# Patient Record
Sex: Male | Born: 1975 | Race: White | Hispanic: No | Marital: Single | Smoking: Never smoker
Health system: Southern US, Community
[De-identification: ages and names within clinical notes are randomized; demographics above are authoritative.]

## PROBLEM LIST (undated history)

## (undated) DIAGNOSIS — C801 Malignant (primary) neoplasm, unspecified: Secondary | ICD-10-CM

## (undated) DIAGNOSIS — F419 Anxiety disorder, unspecified: Secondary | ICD-10-CM

## (undated) HISTORY — PX: FOOT AMPUTATION: SHX951

## (undated) HISTORY — DX: Malignant (primary) neoplasm, unspecified: C80.1

## (undated) HISTORY — PX: HERNIA REPAIR: SHX51

## (undated) HISTORY — DX: Anxiety disorder, unspecified: F41.9

---

## 1998-05-30 ENCOUNTER — Encounter: Payer: Self-pay | Admitting: Orthopedic Surgery

## 1998-05-30 ENCOUNTER — Ambulatory Visit (HOSPITAL_COMMUNITY): Admission: RE | Admit: 1998-05-30 | Discharge: 1998-05-30 | Payer: Self-pay | Admitting: Orthopedic Surgery

## 1998-11-29 ENCOUNTER — Ambulatory Visit (HOSPITAL_BASED_OUTPATIENT_CLINIC_OR_DEPARTMENT_OTHER): Admission: RE | Admit: 1998-11-29 | Discharge: 1998-11-29 | Payer: Self-pay | Admitting: General Surgery

## 2017-09-03 ENCOUNTER — Encounter: Payer: Self-pay | Admitting: Family Medicine

## 2017-09-30 ENCOUNTER — Encounter: Payer: Self-pay | Admitting: Family Medicine

## 2017-09-30 ENCOUNTER — Ambulatory Visit: Payer: Self-pay

## 2017-09-30 ENCOUNTER — Ambulatory Visit (INDEPENDENT_AMBULATORY_CARE_PROVIDER_SITE_OTHER): Payer: BLUE CROSS/BLUE SHIELD | Admitting: Family Medicine

## 2017-09-30 VITALS — BP 110/76 | HR 89 | Ht 67.0 in | Wt 193.0 lb

## 2017-09-30 DIAGNOSIS — M7989 Other specified soft tissue disorders: Secondary | ICD-10-CM

## 2017-09-30 DIAGNOSIS — R2242 Localized swelling, mass and lump, left lower limb: Secondary | ICD-10-CM | POA: Diagnosis not present

## 2017-09-30 DIAGNOSIS — M79672 Pain in left foot: Secondary | ICD-10-CM | POA: Diagnosis not present

## 2017-09-30 NOTE — Progress Notes (Signed)
[facility] [address] [place], [place] [address] Phone: [phone] Subjective:    I'm seeing this patient by the request  of:  Derinda Late, MD   CC: Foot pain  [id number]  Ricky Hansen is a 42 y.o. male coming in with complaint of left foot pain in the midfoot. He had swelling in that area 2 years ago when he was in [place] for his [place]. His foot would become aggrevated when walking for prolonged periods. He notes some swelling and black toes after walking for prolonged periods in boots, sandals and tennis shoes. A couple weeks ago he had an increase in swelling and is not sure why. He does go to the gym but did not do more than usual to increase his swelling and discoloration in his toes.  Patient was seen in urgent care facility.  Did have an ultrasound at that time and was concern for potential cellulitis.  Patient was given antibiotics which may have helped some of the mild discoloration but did not help well with the underlying swelling.  Patient would like to know what it is and what to do.       History reviewed. No pertinent past medical history. History reviewed. No pertinent surgical history. Social History   Socioeconomic History  . Marital status: Single    Spouse name: Not on file  . Number of children: Not on file  . Years of education: Not on file  . Highest education level: Not on file  Occupational History  . Not on file  Social Needs  . Financial resource strain: Not on file  . Food insecurity:    Worry: Not on file    Inability: Not on file  . Transportation needs:    Medical: Not on file    Non-medical: Not on file  Tobacco Use  . Smoking status: Not on file  Substance and Sexual Activity  . Alcohol use: Not on file  . Drug use: Not on file  . Sexual activity: Not on file  Lifestyle  . Physical activity:    Days per week: Not on file    Minutes per session: Not on file  . Stress: Not on file  Relationships   . Social connections:    Talks on phone: Not on file    Gets together: Not on file    Attends religious service: Not on file    Active member of club or organization: Not on file    Attends meetings of clubs or organizations: Not on file    Relationship status: Not on file  Other Topics Concern  . Not on file  Social History Narrative  . Not on file   Not on File History reviewed. No pertinent family history.   Past medical history, social, surgical and family history all reviewed in electronic medical record.  No pertanent information unless stated regarding to the chief complaint.   Review of Systems:Review of systems updated and as accurate as of 09/30/17  No headache, visual changes, nausea, vomiting, diarrhea, constipation, dizziness, abdominal pain, skin rash, fevers, chills, night sweats, weight loss, swollen lymph nodes, body aches, joint swelling, muscle aches, chest pain, shortness of breath, mood changes.   Objective  Blood pressure 110/76, pulse 89, height 5' 7"  (1.702 m), weight 193 lb (87.5 kg), SpO2 92 %. Systems examined below as of 09/30/17   General: No apparent distress alert and oriented x3 mood and affect normal, dressed appropriately.  HEENT:  Pupils equal, extraocular movements intact  Respiratory: Patient's speak in full sentences and does not appear short of breath  Cardiovascular: No lower extremity edema, non tender, no erythema  Skin: Warm dry intact with no signs of infection or rash on extremities or on axial skeleton.  Abdomen: Soft nontender  Neuro: Cranial nerves II through XII are intact, neurovascularly intact in all extremities with 2+ DTRs and 2+ pulses.  Lymph: No lymphadenopathy of posterior or anterior cervical chain or axillae bilaterally.  Gait normal with good balance and coordination.  MSK:  Non tender with full range of motion and good stability and symmetric strength and tone of shoulders, elbows, wrist, hip, knee bilaterally.    Patient's left ankle exam shows that he does have significant swelling over the medial dorsal aspect of the foot.  Seems to be fluctuant.  Not tender to palpation.  Does have bruising distally to this area.  Patient states that this is constant.  This swelling seems to go past the ankle mortise joints more proximally.  At the most proximal aspect of the swelling there is a very small inclusion cyst that is freely movable.  It seems to be within the anterior tibialis tendon sheath.  Patient's ankle has full range of motion.  Very minimally tender to palpation at all in this area.  Full range of motion the ankle and neurovascularly intact distally.  Deep tendon reflexes intact.  Good capillary refill distally.  Limited musculoskeletal ultrasound was performed and interpreted by Lyndal Pulley  Limited ultrasound of the dorsum of the left foot shows that patient does have soft tissue changes noted.  Patient does have edema noted and superficial to the soft tissue changes.  Significant abnormality of the vascularity in the area.  Difficult to assess if more of a varicose vein versus the potential for an AV malformation secondary to the pulsatile aspect and the compression of him.  No true sign of any type of deep venous thrombosis at this time. 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    Impression and Recommendations:     This case required medical decision making of moderate complexity.      Note: This dictation was prepared with Dragon dictation along with smaller phrase technology. Any transcriptional errors that result from this process are unintentional.

## 2017-09-30 NOTE — Assessment & Plan Note (Signed)
On ultrasound today patient does have findings that are consistent with a soft tissue mass with a very abnormal vascularity.  I do not believe that this is a sarcoma though.  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  I do not see any signs of an infectious etiology at this moment and patient has just gone off of different medications.  Differential includes gout but I do not think so.  Due to the longevity of this over the course of the last 3 years with potentially some mild increasing in size and how it is affecting patient's daily activities advanced imaging including MRI with and without contrast will be necessary.  We will evaluate this and see what scan going on and patient will follow up with me again after imaging

## 2017-09-30 NOTE — Patient Instructions (Signed)
Great to meet you  Sorry not for a great answer I do feel there is likely a abnormality of the blood vessel.  We will get MRI of the foot and ankle with and with out contrast to check it out.  I will write you and tell you what it says and give you choices on next step

## 2017-10-09 ENCOUNTER — Telehealth: Payer: Self-pay | Admitting: Family Medicine

## 2017-10-09 ENCOUNTER — Other Ambulatory Visit: Payer: Self-pay | Admitting: Family Medicine

## 2017-10-09 NOTE — Telephone Encounter (Signed)
Copied from [place] [id number]. Topic: Quick Communication - See Telephone Encounter >> Oct 09, 2017  4:08 PM Aurelio Brash B wrote: CRM for notification. See Telephone encounter for: 10/09/17.  Ena Dawley from [facility]  called to say the Pt is schedule  For a foot and ankle mri ,  only 1 of those are authorized, do we need to cancel  the other?   Her  Contact number is [phone]

## 2017-10-12 NOTE — Telephone Encounter (Signed)
lmovm making noel aware that the pt's insurance would only approve the  MRI ankle.

## 2017-10-14 ENCOUNTER — Other Ambulatory Visit: Payer: BLUE CROSS/BLUE SHIELD

## 2017-10-14 ENCOUNTER — Ambulatory Visit
Admission: RE | Admit: 2017-10-14 | Discharge: 2017-10-14 | Disposition: A | Discharge status: Home / Self Care | Payer: BLUE CROSS/BLUE SHIELD | Source: Ambulatory Visit | Attending: Family Medicine | Admitting: Family Medicine

## 2017-10-14 DIAGNOSIS — R2242 Localized swelling, mass and lump, left lower limb: Secondary | ICD-10-CM

## 2017-10-14 MED ORDER — GADOBENATE DIMEGLUMINE 529 MG/ML IV SOLN
17.0000 mL | Freq: Once | INTRAVENOUS | Status: AC | PRN
  Administered 2017-10-14: 17 mL via INTRAVENOUS

## 2017-10-16 ENCOUNTER — Other Ambulatory Visit: Payer: Self-pay

## 2017-10-16 DIAGNOSIS — M7989 Other specified soft tissue disorders: Secondary | ICD-10-CM

## 2017-10-16 DIAGNOSIS — R2242 Localized swelling, mass and lump, left lower limb: Principal | ICD-10-CM

## 2017-11-02 ENCOUNTER — Ambulatory Visit (INDEPENDENT_AMBULATORY_CARE_PROVIDER_SITE_OTHER): Payer: BLUE CROSS/BLUE SHIELD | Admitting: Family Medicine

## 2017-11-02 VITALS — BP 122/80 | HR 62 | Ht 67.0 in | Wt 188.0 lb

## 2017-11-02 DIAGNOSIS — C499 Malignant neoplasm of connective and soft tissue, unspecified: Secondary | ICD-10-CM | POA: Diagnosis not present

## 2017-11-02 NOTE — Assessment & Plan Note (Signed)
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  Differential included a AV malformation previously.  Patient is scheduled for an open biopsy tomorrow for further evaluation.  Patient would like second opinion before he undergoes the possibility of amputation which I think it is significantly reasonable.  Will be referred today.  Discussed with patient's that we are here for any type of questions necessary and we can continue to help him through this difficult decision.  Spent  25 minutes with patient face-to-face and had greater than 50% of counseling including as described above in assessment and plan.

## 2017-11-02 NOTE — Progress Notes (Signed)
[facility] [address] [place], [place] [address] Phone: [phone] Subjective:    I'm seeing this patient by the request  of:   Derinda Late, MD   CC: Left foot mass  EZM:[id number]  Ricky Hansen is a 42 y.o. male coming in with complaint of left foot numbness.  Patient was seen previously and on ultrasound there was significant atypical vascularization noted.  Very large soft tissue mass appreciated.  Sent for an MRI.  MRI showed a soft tissue mass along the dorsal medial aspect of the left foot for vascular malformation versus a soft tissue sarcoma.  Patient was then sent to Dr. Leonides Schanz for further evaluation.  Core biopsy done.  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  Patient has had recommendations now above below the knee amputation.  Patient was going to have an open biopsy done.  Patient is scheduled for this tomorrow.  Patient states he wants to make sure he is making the right decision.  Wanting to know what else can be possibly done.  Wanting to see if there could be a second opinion.     No past medical history on file. No past surgical history on file. Social History   Socioeconomic History  . Marital status: Single    Spouse name: Not on file  . Number of children: Not on file  . Years of education: Not on file  . Highest education level: Not on file  Occupational History  . Not on file  Social Needs  . Financial resource strain: Not on file  . Food insecurity:    Worry: Not on file    Inability: Not on file  . Transportation needs:    Medical: Not on file    Non-medical: Not on file  Tobacco Use  . Smoking status: Not on file  Substance and Sexual Activity  . Alcohol use: Not on file  . Drug use: Not on file  . Sexual activity: Not on file  Lifestyle  . Physical activity:    Days per week: Not on file    Minutes per session: Not on file  . Stress: Not on file  Relationships  . Social connections:   Talks on phone: Not on file    Gets together: Not on file    Attends religious service: Not on file    Active member of club or organization: Not on file    Attends meetings of clubs or organizations: Not on file    Relationship status: Not on file  Other Topics Concern  . Not on file  Social History Narrative  . Not on file   Not on File No family history on file.   Past medical history, social, surgical and family history all reviewed in electronic medical record.  No pertanent information unless stated regarding to the chief complaint.   Review of Systems:Review of systems updated and as accurate as of 11/02/17  No headache, visual changes, nausea, vomiting, diarrhea, constipation, dizziness, abdominal pain, skin rash, fevers, chills, night sweats, weight loss, swollen lymph nodes, body aches, joint swelling, muscle aches, chest pain, shortness of breath, mood changes.   Objective  Blood pressure 122/80, pulse 62, height 5' 7"  (1.702 m), weight 188 lb (85.3 kg), SpO2 98 %. Systems examined below as of 11/02/17   General: No apparent distress alert and oriented x3 mood and affect normal, dressed appropriately.  HEENT: Pupils equal, extraocular movements intact  Respiratory: Patient's speak in  full sentences and does not appear short of breath  Cardiovascular: No lower extremity edema, non tender, no erythema  Skin: Warm dry intact with no signs of infection or rash on extremities or on axial skeleton.  Abdomen: Soft nontender  Neuro: Cranial nerves II through XII are intact, neurovascularly intact in all extremities with 2+ DTRs and 2+ pulses.  Lymph: No lymphadenopathy of posterior or anterior cervical chain or axillae bilaterally.  Gait normal with good balance and coordination.  MSK:  Non tender with full range of motion and good stability and symmetric strength and tone of shoulders, elbows, wrist, hip, knee and bilaterally.   Left ankle exam still shows the patient has  significant swelling over the medial dorsal aspect of the foot.  Nontender.  Not movable.  Still some mild underlying bruising still noted.  It does go proximal past the ankle mortise.  Ankle does still have full range of motion.  Minimally tender to palpation around the ankle itself.  Neurovascular intact distally.  Dorsalis pedis pulse brisk   Impression and Recommendations:     This case required medical decision making of moderate complexity.      Note: This dictation was prepared with Dragon dictation along with smaller phrase technology. Any transcriptional errors that result from this process are unintentional.

## 2017-11-02 NOTE — Patient Instructions (Signed)
Good to see you  We will figure it out.  We will get you to [facility] as well to discuss Dr. Standley Brooking office and we will try to get it set up  Good luck with the procedure tomorrow and lets see what comes back  Write me any questions Expect a lot more tests and information coming your way and I will help you navigate I will try to get you info on the other docs in other states if you want.

## 2017-11-05 ENCOUNTER — Telehealth: Payer: Self-pay | Admitting: Family Medicine

## 2017-11-05 DIAGNOSIS — C499 Malignant neoplasm of connective and soft tissue, unspecified: Secondary | ICD-10-CM

## 2017-11-05 NOTE — Telephone Encounter (Signed)
PET scan ordered & scheduled 6.10.19 @ 2pm. Pt made aware.

## 2017-11-05 NOTE — Telephone Encounter (Signed)
Copied from [place] [phone] [phone]. Topic: Quick Communication - See Telephone Encounter >> Nov 05, 2017 11:18 AM Bea Graff, NT wrote: CRM for notification. See Telephone encounter for: 11/05/17. Pt requesting a call from Dr. 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

## 2017-11-05 NOTE — Telephone Encounter (Signed)
Called patient back.  Patient undergone biopsy.  Final pathology results will not be known for 2 to 3 weeks.  So far inconclusive findings.  Patient is concerned though and wants to make sure that there is nothing else that is potentially spreading.  Would like to know what would be the next best test.  Patient brought up the idea of a PET scan.  I do feel that this could help out with grading of anything that would occur and I will order that at this time.  Patient is being scheduled also for second opinion from case amputation as necessary.  We will continue to monitor.

## 2017-11-09 ENCOUNTER — Ambulatory Visit (HOSPITAL_COMMUNITY)
Admission: RE | Admit: 2017-11-09 | Discharge: 2017-11-09 | Disposition: A | Discharge status: Home / Self Care | Payer: BLUE CROSS/BLUE SHIELD | Source: Ambulatory Visit | Attending: Family Medicine | Admitting: Family Medicine

## 2017-11-09 DIAGNOSIS — C499 Malignant neoplasm of connective and soft tissue, unspecified: Secondary | ICD-10-CM | POA: Diagnosis not present

## 2017-11-09 LAB — GLUCOSE, CAPILLARY: GLUCOSE-CAPILLARY: 111 mg/dL — AB (ref 65–99)

## 2017-11-09 MED ORDER — FLUDEOXYGLUCOSE F - 18 (FDG) INJECTION: 9.1000 | Freq: Once | INTRAVENOUS | Status: DC | PRN

## 2017-11-13 ENCOUNTER — Encounter: Payer: Self-pay | Admitting: Family Medicine

## 2017-11-26 ENCOUNTER — Encounter: Payer: Self-pay | Admitting: Family Medicine

## 2018-06-15 ENCOUNTER — Ambulatory Visit: Payer: BLUE CROSS/BLUE SHIELD | Attending: Physical Medicine & Rehabilitation | Admitting: Physical Therapy

## 2018-06-15 DIAGNOSIS — M79605 Pain in left leg: Secondary | ICD-10-CM | POA: Insufficient documentation

## 2018-06-15 DIAGNOSIS — R2689 Other abnormalities of gait and mobility: Secondary | ICD-10-CM | POA: Diagnosis not present

## 2018-06-15 DIAGNOSIS — R2681 Unsteadiness on feet: Secondary | ICD-10-CM | POA: Insufficient documentation
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
## 2018-06-21 ENCOUNTER — Encounter: Payer: Self-pay | Admitting: Physical Therapy

## 2018-06-21 ENCOUNTER — Ambulatory Visit: Payer: BLUE CROSS/BLUE SHIELD | Admitting: Physical Therapy

## 2018-06-21 DIAGNOSIS — R2689 Other abnormalities of gait and mobility: Secondary | ICD-10-CM | POA: Diagnosis not present

## 2018-06-21 DIAGNOSIS — M79605 Pain in left leg: Secondary | ICD-10-CM

## 2018-06-21 DIAGNOSIS — R2681 Unsteadiness on feet: Secondary | ICD-10-CM

## 2018-06-22 NOTE — Therapy (Signed)
[facility] [address] [place], [place], [address] Phone: [phone]   Fax:  [phone]  Physical Therapy Treatment  Patient Details  Name: Ricky Hansen MRN: [id number] Date of Birth: 13-Dec-1975 Referring Provider (PT): Rolena Infante, MD   Encounter Date: 06/21/2018  PT End of Session - 06/21/18 1146    Visit Number  2    Number of Visits  9    Date for PT Re-Evaluation  08/13/18    Authorization Type  BCBS    PT Start Time  1100    PT Stop Time  1138    PT Time Calculation (min)  38 min    Activity Tolerance  Patient tolerated treatment well    Behavior During Therapy  [facility] for tasks assessed/performed       History reviewed. No pertinent past medical history.  History reviewed. No pertinent surgical history.  There were no vitals filed for this visit.  Subjective Assessment - 06/21/18 1100    Subjective  He has been wearing prosthesis 5 hrs 2x/day. Around 2 hrs of 2nd wear the shin bone becomes uncomfortable.     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 His work does require some lifting, climbing, push/pull    Currently in Pain?  No/denies                       [facility] Adult PT Treatment/Exercise - 06/21/18 1100      Transfers   Transfers  Floor to Transfer    Floor to Transfer  5: Supervision    Floor to Transfer Details (indicate cue type and reason)  demo & instruction in floor transfer pushing on horizontal surface.  Recommend not using prosthesis for half kneel due to hole in suction liner will break the suction.   PT also demo kneeling on kneel bench or pillows to decrease knee flexion & soften prosthesis pressure against floor. Pt verbalized & return demo understanding.       Ambulation/Gait   Ambulation/Gait  Yes    Ambulation/Gait Assistance  5: Supervision    Ambulation/Gait Assistance Details  working on gait witout abduction: visual, demo, tactile & verbal cues    Ambulation Distance (Feet)  500 Feet    Assistive device  Prosthesis;None    Ambulation Surface  Level;Indoor      Therapeutic Activites    Therapeutic Activities  Lifting;Work Sports coach with TTA prosthesis Pt return demo initially picking up 25# box and placing on floor.      Work Radiation protection practitioner push / pull motions with weight shift between feet & pt return demo understanding.  PT demo, instructed in climbing an A-frame ladder, pt return demo with tactile / manual guard & verbal cues.       Neuro Re-ed    Neuro Re-ed Details   standing without abduction and weight shift to midline prior to beginning stationary activity      Prosthetics   Prosthetic Care Comments   PT instructed in adjusting ply socks including use of cut-off sock.  Increase wear to 6hrs drying half way 2x/day.     Current prosthetic wear tolerance (days/week)   daily    Current prosthetic wear tolerance (#hours/day)  5 hrs 2x/day drying half way    Edema  none    Residual limb condition   3 wounds with scabs are smaller with dry edges. No other issues noted.     Education Provided  Skin check;Residual limb care;Correct ply sock adjustment;Proper wear schedule/adjustment    Person(s) Educated  Patient    Education Method  Explanation;Verbal cues;Demonstration    Education Method  Verbalized understanding;Needs further instruction               PT Short Term Goals - 06/15/18 2215      PT SHORT TERM GOAL #1   Title  Patient tolerates prosthesis wear >12hrs/day total without skin issues. (All STG Target Dates: 07/16/2018)    Time  1    Period  Months    Status  New    Target Date  07/16/18      PT SHORT TERM GOAL #2   Title  Patient verbalizes proper adjustment of ply socks for limb volume  changes.     Time  1    Period  Months    Status  New    Target Date  07/16/18      PT SHORT TERM GOAL #3   Title  Patient demonstrates lifting & carrying 20# box with verbal cues.     Time  1    Period  Months    Status  New    Target Date  07/16/18      PT SHORT TERM GOAL #4   Title  Patient ambulates 500' with prosthesis only scanning without loss of balance.     Time  1    Period  Months    Status  New    Target Date  07/16/18        PT Long Term Goals - 06/15/18 2207      PT LONG TERM GOAL #1   Title  Patient verbalizes & demonstrates understanding of prosthetic care for safe use of prosthesis. (All LTGs Target Date: 08/13/2018)    Time  2    Period  Months    Status  New    Target Date  08/13/18      PT LONG TERM GOAL #2   Title  Patient tolerates prosthesis wear >90% of awake hours without skin or limb pain issues to enable function throughout his day.     Time  2    Period  Months    Status  New    Target Date  08/13/18      PT LONG TERM GOAL #3   Title  Functional Gait Assessment >22/30 to indicate lower fall risk.     Time  2    Period  Months    Status  New    Target Date  08/13/18      PT LONG TERM GOAL #4   Title  Patient ambulates >1000' including grass, ramps, curbs & stairs without device except prosthesis independently.     Time  2    Period  Months    Status  New    Target Date  08/13/18      PT LONG TERM GOAL #5   Title  Patient demonstrates proper prosthesis use with lifting, carrying, pushing, pulling & climbing to enable return to work tasks.     Time  8    Period  Months    Status  New    Target Date  08/13/18      Additional Long  Term Goals   Additional Long Term Goals  Yes      PT LONG TERM GOAL #6   Title  Patient demonstrates & verbalizes proper prosthesis use with exercising including lifting weights, cycling, etc.     Time  2    Period  Months    Status  New    Target Date  08/13/18            Plan - 06/21/18  1521    Clinical Impression Statement  Today's skilled session focused on prosthetic care of adjusting ply socks, prosthetic stance & gait without abduction and work tasks of push, pull, lifting, climbing ladder & floor transfers. Pt improved with skilled instruction.     Rehab Potential  Good    PT Frequency  1x / week    PT Duration  8 weeks    PT Treatment/Interventions  ADLs/Self Care Home Management;DME Instruction;Gait training;Stair training;Functional mobility training;Therapeutic activities;Therapeutic exercise;Balance training;Neuromuscular re-education;Patient/family education;Prosthetic Training;Vestibular    PT Next Visit Plan  skin check, instruct in adjusting ply socks, HEP theraband equal WB,     Consulted and Agree with Plan of Care  Patient       Patient will benefit from skilled therapeutic intervention in order to improve the following deficits and impairments:  Abnormal gait, Decreased activity tolerance, Decreased balance, Decreased endurance, Decreased knowledge of use of DME, Decreased mobility, Postural dysfunction, Prosthetic Dependency  Visit Diagnosis: Other abnormalities of gait and mobility  Unsteadiness on feet  Pain in left leg     Problem List Patient Active Problem List   Diagnosis Date Noted  . 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 Mass of soft tissue of left lower extremity 09/30/2017    Kenleigh Toback  PT, DPT 06/22/2018, 6:00 AM  [facility] [address] [place], [place], [address] Phone: [phone]   Fax:  [phone]  Name: Ricky Hansen MRN: [id number] Date of Birth: [birth year]

## 2018-06-28 ENCOUNTER — Encounter: Payer: Self-pay | Admitting: Physical Therapy

## 2018-06-28 ENCOUNTER — Ambulatory Visit: Payer: BLUE CROSS/BLUE SHIELD | Admitting: Physical Therapy

## 2018-06-28 DIAGNOSIS — R2689 Other abnormalities of gait and mobility: Secondary | ICD-10-CM

## 2018-06-28 DIAGNOSIS — R2681 Unsteadiness on feet: Secondary | ICD-10-CM

## 2018-06-28 DIAGNOSIS — M79605 Pain in left leg: Secondary | ICD-10-CM

## 2018-06-28 NOTE — Therapy (Signed)
[facility] [address] [place], [place], [address] Phone: [phone]   Fax:  [phone]  Physical Therapy Treatment  Patient Details  Name: Ricky Hansen MRN: [id number] Date of Birth: 12-30-1975 Referring Provider (PT): Rolena Infante, MD   Encounter Date: 06/28/2018  PT End of Session - 06/28/18 1305    Visit Number  3    Number of Visits  9    Date for PT Re-Evaluation  08/13/18    Authorization Type  BCBS    PT Start Time  1103    PT Stop Time  1147    PT Time Calculation (min)  44 min    Activity Tolerance  Patient tolerated treatment well    Behavior During Therapy  [facility] for tasks assessed/performed       History reviewed. No pertinent past medical history.  History reviewed. No pertinent surgical history.  There were no vitals filed for this visit.  Subjective Assessment - 06/28/18 1108    Subjective  He has been wearing prosthesis about 12 hrs/day. Around 2 hrs of 2nd wear the shin bone becomes uncomfortable. Prosthetist plans to recast in mid February.    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 His work does require some lifting, climbing, push/pull    Currently in Pain?  No/denies                       [facility] Adult PT Treatment/Exercise - 06/28/18 0001      Ambulation/Gait   Stairs  Yes    Stairs Assistance  5: Supervision    Stairs Assistance Details (indicate cue type and reason)  cues for prosthetic foot placement and technique on how to progress form step to to alternating pattern    Stair Management Technique  Two rails;One rail Left;No rails;Step to pattern;Alternating pattern;Forwards    Number of Stairs  4   x6   Height of Stairs  6      Prosthetics   Prosthetic Care Comments   skin  care when sweating (using mild antiperspirant as needed, not allowing to air dry); discussed ply sock adjustment, discussed needing to avoid abduction of prosthesis to avoid medial knee pain.                                  Current prosthetic wear tolerance (days/week)   daily    Current prosthetic wear tolerance (#hours/day)   6 hrs 2x/day    Current prosthetic weight-bearing tolerance (hours/day)   Pt tolerated standing & gait for 15 minutes without limb pain    Edema  none    Residual limb condition   wound with scab on incision, 2 blisters on distal limb, no signs of infection, good hair growth, dark red circle distal limb (appears from end weight bearing, slightly sweaty    Education Provided  Skin check;Residual limb care;Correct ply sock adjustment;Proper wear schedule/adjustment    Person(s) Educated  Patient    Education Method  Explanation;Verbal cues    Education Method  Verbalized understanding          Balance Exercises - 06/28/18 1126      Balance Exercises: Standing   Other Standing Exercises  standing  with feet apart (wide BOS) working on keeping weight midline, then slowly increasing weight onto L prosthesis as tolerated with UE activity with blue theraband: Alt, flexion, extension, adduction and abduction, cues for core activation and residual limb positioning in socket.                                            PT Education - 06/28/18 1305    Education Details  standing balance with blue theraband.      PT Short Term Goals - 06/15/18 2215      PT SHORT TERM GOAL #1   Title  Patient tolerates prosthesis wear >12hrs/day total without skin issues. (All STG Target Dates: 07/16/2018)    Time  1    Period  Months    Status  New    Target Date  07/16/18      PT SHORT TERM GOAL #2   Title  Patient verbalizes proper adjustment of ply socks for limb volume changes.     Time  1    Period  Months    Status  New    Target Date  07/16/18      PT SHORT TERM GOAL #3    Title  Patient demonstrates lifting & carrying 20# box with verbal cues.     Time  1    Period  Months    Status  New    Target Date  07/16/18      PT SHORT TERM GOAL #4   Title  Patient ambulates 500' with prosthesis only scanning without loss of balance.     Time  1    Period  Months    Status  New    Target Date  07/16/18        PT Long Term Goals - 06/15/18 2207      PT LONG TERM GOAL #1   Title  Patient verbalizes & demonstrates understanding of prosthetic care for safe use of prosthesis. (All LTGs Target Date: 08/13/2018)    Time  2    Period  Months    Status  New    Target Date  08/13/18      PT LONG TERM GOAL #2   Title  Patient tolerates prosthesis wear >90% of awake hours without skin or limb pain issues to enable function throughout his day.     Time  2    Period  Months    Status  New    Target Date  08/13/18      PT LONG TERM GOAL #3   Title  Functional Gait Assessment >22/30 to indicate lower fall risk.     Time  2    Period  Months    Status  New    Target Date  08/13/18      PT LONG TERM GOAL #4   Title  Patient ambulates >1000' including grass, ramps, curbs & stairs without device except prosthesis independently.     Time  2    Period  Months    Status  New    Target Date  08/13/18      PT LONG TERM GOAL #5   Title  Patient demonstrates proper prosthesis use with lifting, carrying, pushing, pulling & climbing to enable return to work tasks.     Time  8    Period  Months  Status  New    Target Date  08/13/18      Additional Long Term Goals   Additional Long Term Goals  Yes      PT LONG TERM GOAL #6   Title  Patient demonstrates & verbalizes proper prosthesis use with exercising including lifting weights, cycling, etc.     Time  2    Period  Months    Status  New    Target Date  08/13/18            Plan - 06/28/18 1306    Clinical Impression Statement  Skilled session focused on residual limb, prosthetic care,  standing  balance and L residual limb NMR with resistence bands.  Pt is progressing well with understanding of prosthetic training. Pt able to perform standing activitiy with resistence bands but tolerance is limited reporting soreness on medial aspect of L knee with increase LLE weight bearing.                                                            Rehab Potential  Good    PT Frequency  1x / week    PT Duration  8 weeks    PT Treatment/Interventions  ADLs/Self Care Home Management;DME Instruction;Gait training;Stair training;Functional mobility training;Therapeutic activities;Therapeutic exercise;Balance training;Neuromuscular re-education;Patient/family education;Prosthetic Training;Vestibular    PT Next Visit Plan  skin check, instruct in adjusting ply socks, check HEP theraband equal WB,     Consulted and Agree with Plan of Care  Patient       Patient will benefit from skilled therapeutic intervention in order to improve the following deficits and impairments:  Abnormal gait, Decreased activity tolerance, Decreased balance, Decreased endurance, Decreased knowledge of use of DME, Decreased mobility, Postural dysfunction, Prosthetic Dependency  Visit Diagnosis: Other abnormalities of gait and mobility  Unsteadiness on feet  Pain in left leg     Problem List Patient Active Problem List   Diagnosis Date Noted  . 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 Mass of soft tissue of left lower extremity 09/30/2017   Bjorn Loser, PTA  06/28/18, 1:14 PM [facility] [address] [place], [place], [address] Phone: [phone]   Fax:  [phone]  Name: ARMIN YERGER MRN: [id number] Date of Birth: 11/16/1975

## 2018-06-28 NOTE — Patient Instructions (Signed)
Access Code: [email or web address]  URL: [email or web address]  Date: 06/28/2018  Prepared by: Bjorn Loser   Exercises  Alternating Shoulder Flexion Overhead with Resistance - 10 reps - 2 sets - 3 hold - 1-2x daily - 5x weekly  Standing Alternating Shoulder Extension with Resistance - 10 reps - 2 sets - 3 hold - 1-2x daily - 5x weekly  Standing Single Arm Shoulder Abduction with Anchored Resistance - 10 reps - 2 sets - 3 hold - 1-2x daily - 5x weekly  Shoulder Adduction with Anchored Resistance - 10 reps - 2 sets - 1-2x daily - 5x weekly

## 2018-07-07 ENCOUNTER — Ambulatory Visit: Payer: BLUE CROSS/BLUE SHIELD | Attending: Physical Medicine & Rehabilitation | Admitting: Physical Therapy

## 2018-07-07 ENCOUNTER — Encounter: Payer: Self-pay | Admitting: Physical Therapy

## 2018-07-07 DIAGNOSIS — R2681 Unsteadiness on feet: Secondary | ICD-10-CM | POA: Insufficient documentation

## 2018-07-07 DIAGNOSIS — M79605 Pain in left leg: Secondary | ICD-10-CM | POA: Insufficient documentation

## 2018-07-07 DIAGNOSIS — R2689 Other abnormalities of gait and mobility: Secondary | ICD-10-CM | POA: Insufficient documentation

## 2018-07-07 NOTE — Therapy (Signed)
[facility] [address] [place], [place], [address] Phone: [phone]   Fax:  [phone]  Physical Therapy Treatment  Patient Details  Name: Ricky Hansen MRN: [id number] Date of Birth: [birth year] Referring Provider (PT): Rolena Infante, MD   Encounter Date: 07/07/2018  PT End of Session - 07/07/18 0855    Visit Number  4    Number of Visits  9    Date for PT Re-Evaluation  08/13/18    Authorization Type  BCBS    PT Start Time  0800    PT Stop Time  0843    PT Time Calculation (min)  43 min    Activity Tolerance  Patient tolerated treatment well    Behavior During Therapy  [facility] for tasks assessed/performed       History reviewed. No pertinent past medical history.  History reviewed. No pertinent surgical history.  There were no vitals filed for this visit.  Subjective Assessment - 07/07/18 0800    Subjective  He developed blisters on Saturday, 5 days ago. He is wearing prosthesis all day but has not noticed sweating.     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 His work does require some lifting, climbing, push/pull    Currently in Pain?  No/denies                       [facility] Adult PT Treatment/Exercise - 07/07/18 0800      Prosthetics   Prosthetic Care Comments   PT instructed in use of tegaderm for blister at residual limb, drying limb/liner & switching wicking sock q3-4 hrs or sooner if sweating.     Current prosthetic wear tolerance (days/week)   daily    Current prosthetic wear tolerance (#hours/day)   6 hrs 2x/day    Residual limb condition   Patient has large blister at distal residual limb that is healing    Education Provided  Skin check;Residual limb care;Ply sock cleaning;Correct ply sock  adjustment;Proper wear schedule/adjustment;Other (comment)   see prosthetic care comments      Isometric residual limb muscle contraction to minimize atrophy of muscles. Using contralateral foot / ankle for carryover. Perform sitting, standing & with stepping motions.  Ankle plantarflexion, dorsiflexion, combined motions going to 4 corners of box (imaginary around foot) Access Code: [email or web address]  URL: [email or web address]  Date: 07/07/2018  Prepared by: Jamey Reas   Exercises  Alternating Shoulder Flexion Overhead with Resistance - 10 reps - 2 sets - 3 hold - 1-2x daily - 5x weekly  Standing Alternating Shoulder Extension with Resistance - 10 reps - 2 sets - 3 hold - 1-2x daily - 5x weekly  Standing Single Arm Shoulder Abduction with Anchored Resistance - 10 reps - 2 sets - 3 hold - 1-2x daily - 5x weekly  Shoulder Adduction with Anchored Resistance - 10 reps - 2 sets - 1-2x daily - 5x weekly  Standing Hip Flexion with Resistance - 10 reps - 1 sets - 1x daily - 7x weekly  Standing Hip Extension with Resistance - 10 reps - 1 sets - 1x daily - 7x weekly  Standing Hip Adduction with Resistance - 10 reps - 1 sets - 1x daily - 7x weekly  Standing Hip Abduction with Theraband Resistance - 10 reps -  1 sets - 1x daily - 7x weekly         PT Education - 07/07/18 0906    Education Details  updated HEP for residual limb isometric muscle contractions & LEs hip theraband kicks    Person(s) Educated  Patient    Methods  Explanation;Demonstration;Verbal cues;Handout    Comprehension  Verbalized understanding;Returned demonstration       PT Short Term Goals - 06/15/18 2215      PT SHORT TERM GOAL #1   Title  Patient tolerates prosthesis wear >12hrs/day total without skin issues. (All STG Target Dates: 07/16/2018)    Time  1    Period  Months    Status  New    Target Date  07/16/18      PT SHORT TERM GOAL #2   Title  Patient verbalizes proper adjustment of ply socks for limb  volume changes.     Time  1    Period  Months    Status  New    Target Date  07/16/18      PT SHORT TERM GOAL #3   Title  Patient demonstrates lifting & carrying 20# box with verbal cues.     Time  1    Period  Months    Status  New    Target Date  07/16/18      PT SHORT TERM GOAL #4   Title  Patient ambulates 500' with prosthesis only scanning without loss of balance.     Time  1    Period  Months    Status  New    Target Date  07/16/18        PT Long Term Goals - 06/15/18 2207      PT LONG TERM GOAL #1   Title  Patient verbalizes & demonstrates understanding of prosthetic care for safe use of prosthesis. (All LTGs Target Date: 08/13/2018)    Time  2    Period  Months    Status  New    Target Date  08/13/18      PT LONG TERM GOAL #2   Title  Patient tolerates prosthesis wear >90% of awake hours without skin or limb pain issues to enable function throughout his day.     Time  2    Period  Months    Status  New    Target Date  08/13/18      PT LONG TERM GOAL #3   Title  Functional Gait Assessment >22/30 to indicate lower fall risk.     Time  2    Period  Months    Status  New    Target Date  08/13/18      PT LONG TERM GOAL #4   Title  Patient ambulates >1000' including grass, ramps, curbs & stairs without device except prosthesis independently.     Time  2    Period  Months    Status  New    Target Date  08/13/18      PT LONG TERM GOAL #5   Title  Patient demonstrates proper prosthesis use with lifting, carrying, pushing, pulling & climbing to enable return to work tasks.     Time  8    Period  Months    Status  New    Target Date  08/13/18      Additional Long Term Goals   Additional Long Term Goals  Yes      PT LONG TERM GOAL #6  Title  Patient demonstrates & verbalizes proper prosthesis use with exercising including lifting weights, cycling, etc.     Time  2    Period  Months    Status  New    Target Date  08/13/18            Plan -  07/07/18 0907    Clinical Impression Statement  Today's skilled session focused on educating patient on residual limb care with new onset blister and updating home exercise program to include isometric contractions of residual limb in standing hip kicks       Rehab Potential  Good    PT Frequency  1x / week    PT Duration  8 weeks    PT Treatment/Interventions  ADLs/Self Care Home Management;DME Instruction;Gait training;Stair training;Functional mobility training;Therapeutic activities;Therapeutic exercise;Balance training;Neuromuscular re-education;Patient/family education;Prosthetic Training;Vestibular    PT Next Visit Plan  skin check, check STGs,    Consulted and Agree with Plan of Care  Patient       Patient will benefit from skilled therapeutic intervention in order to improve the following deficits and impairments:  Abnormal gait, Decreased activity tolerance, Decreased balance, Decreased endurance, Decreased knowledge of use of DME, Decreased mobility, Postural dysfunction, Prosthetic Dependency  Visit Diagnosis: Other abnormalities of gait and mobility  Unsteadiness on feet  Pain in left leg     Problem List Patient Active Problem List   Diagnosis Date Noted  . 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 Mass of soft tissue of left lower extremity 09/30/2017    Jamey Reas PT, DPT 07/07/2018, 9:09 AM  [facility] [address] [place], [place], [address] Phone: [phone]   Fax:  [phone]  Name: Ricky Hansen MRN: [id number] Date of Birth: [birth year]

## 2018-07-07 NOTE — Patient Instructions (Addendum)
Isometric residual limb muscle contraction to minimize atrophy of muscles. Using contralateral foot / ankle for carryover. Perform sitting, standing & with stepping motions.  Ankle plantarflexion, dorsiflexion, combined motions going to 4 corners of box (imaginary around foot) Access Code: [email or web address]  URL: [email or web address]  Date: 07/07/2018  Prepared by: Jamey Reas   Exercises  Alternating Shoulder Flexion Overhead with Resistance - 10 reps - 2 sets - 3 hold - 1-2x daily - 5x weekly  Standing Alternating Shoulder Extension with Resistance - 10 reps - 2 sets - 3 hold - 1-2x daily - 5x weekly  Standing Single Arm Shoulder Abduction with Anchored Resistance - 10 reps - 2 sets - 3 hold - 1-2x daily - 5x weekly  Shoulder Adduction with Anchored Resistance - 10 reps - 2 sets - 1-2x daily - 5x weekly  Standing Hip Flexion with Resistance - 10 reps - 1 sets - 1x daily - 7x weekly  Standing Hip Extension with Resistance - 10 reps - 1 sets - 1x daily - 7x weekly  Standing Hip Adduction with Resistance - 10 reps - 1 sets - 1x daily - 7x weekly  Standing Hip Abduction with Theraband Resistance - 10 reps - 1 sets - 1x daily - 7x weekly

## 2018-07-13 ENCOUNTER — Ambulatory Visit: Payer: BLUE CROSS/BLUE SHIELD | Admitting: Physical Therapy

## 2018-07-13 ENCOUNTER — Encounter: Payer: Self-pay | Admitting: Physical Therapy

## 2018-07-13 DIAGNOSIS — R2681 Unsteadiness on feet: Secondary | ICD-10-CM

## 2018-07-13 DIAGNOSIS — R2689 Other abnormalities of gait and mobility: Secondary | ICD-10-CM

## 2018-07-13 DIAGNOSIS — M79605 Pain in left leg: Secondary | ICD-10-CM

## 2018-07-13 NOTE — Therapy (Signed)
[facility] [address] [place], [place], [address] Phone: [phone]   Fax:  [phone]  Physical Therapy Treatment  Patient Details  Name: Ricky Hansen MRN: [id number] Date of Birth: Dec 30, 1975 Referring Provider (PT): Rolena Infante, MD   Encounter Date: 07/13/2018  PT End of Session - 07/13/18 2328    Visit Number  5    Number of Visits  9    Date for PT Re-Evaluation  08/13/18    Authorization Type  BCBS    PT Start Time  1315    PT Stop Time  1342    PT Time Calculation (min)  27 min    Activity Tolerance  Patient tolerated treatment well    Behavior During Therapy  [facility] for tasks assessed/performed       History reviewed. No pertinent past medical history.  History reviewed. No pertinent surgical history.  There were no vitals filed for this visit.  Subjective Assessment - 07/13/18 1315    Subjective  He has been wearing prosthesis most of awake hours. His wound has improved. He hops without device in mornings to toilet.     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 His work does require some lifting, climbing, push/pull    Currently in Pain?  No/denies                       [facility] Adult PT Treatment/Exercise - 07/13/18 1315      Ambulation/Gait   Ambulation/Gait  Yes    Ambulation/Gait Assistance  5: Supervision    Ambulation/Gait Assistance Details  proper step width    Assistive device  Prosthesis;None    Gait Comments  running: treadmill initially 3.13mh for 1 min with BUE support limiting lift.  pre-run stepping over targets 3' apart 50' X 2      Knee/Hip Exercises: Machines for Strengthening   Cybex Knee Extension  PT simulated knee extension machine with proper placement for lower pad.    Cybex Knee Flexion  PT simulated knee flexion machine with proper placement for lower pad.      Knee/Hip Exercises: Plyometrics   Bilateral Jumping  5 reps   jumping upward   Broad Jump  5 reps      Prosthetics   Prosthetic Care Comments   fall risk when prosthesis is off.  Per pt prosthetist is making new socket, PT instructed in using old socket for water leg. PT showed Stomper Jr prosthetic foot may be option.     Current prosthetic wear tolerance (days/week)   daily    Current prosthetic wear tolerance (#hours/day)   most of awake hours    Residual limb condition   blister has healed except one small area with superficial wound    Education Provided  Other (comment);Correct ply sock adjustment   see prosthetic care comments   Person(s) Educated  Patient    Education Method  Explanation;Verbal cues    Education Method  Verbalized understanding;Verbal cues required               PT Short Term Goals - 07/13/18 2329      PT SHORT TERM GOAL #1   Title  Patient tolerates prosthesis wear >12hrs/day total without skin issues. (All STG Target Dates: 07/16/2018)  Baseline  Partially MET 07/13/2018 pt wearing prosthesis most of awake hours. wound developed initially but is healing    Time  1    Period  Months    Status  Partially Met    Target Date  07/16/18      PT SHORT TERM GOAL #2   Title  Patient verbalizes proper adjustment of ply socks for limb volume changes.     Baseline  MET 07/13/2018    Time  1    Period  Months    Status  Achieved    Target Date  07/16/18      PT SHORT TERM GOAL #3   Title  Patient demonstrates lifting & carrying 20# box with verbal cues.     Baseline  MET 07/13/2018    Time  1    Period  Months    Status  Achieved    Target Date  07/16/18      PT SHORT TERM GOAL #4   Title  Patient ambulates 500' with prosthesis only scanning without loss of balance.     Baseline  MET 07/13/2018    Time  1    Period  Months    Status  Achieved    Target  Date  07/16/18        PT Long Term Goals - 07/13/18 2331      PT LONG TERM GOAL #1   Title  Patient verbalizes & demonstrates understanding of prosthetic care for safe use of prosthesis. (All LTGs Target Date: 08/13/2018)    Time  2    Period  Months    Status  On-going    Target Date  08/13/18      PT LONG TERM GOAL #2   Title  Patient tolerates prosthesis wear >90% of awake hours without skin or limb pain issues to enable function throughout his day.     Time  2    Period  Months    Status  On-going    Target Date  08/13/18      PT LONG TERM GOAL #3   Title  Functional Gait Assessment >22/30 to indicate lower fall risk.     Time  2    Period  Months    Status  New      PT LONG TERM GOAL #4   Title  Patient ambulates >1000' including grass, ramps, curbs & stairs without device except prosthesis independently.     Time  2    Period  Months    Status  On-going    Target Date  08/13/18      PT LONG TERM GOAL #5   Title  Patient demonstrates proper prosthesis use with lifting, carrying, pushing, pulling & climbing to enable return to work tasks.     Time  8    Period  Months    Status  On-going    Target Date  08/13/18      PT LONG TERM GOAL #6   Title  Patient demonstrates & verbalizes proper prosthesis use with exercising including lifting weights, cycling, etc.     Time  2    Period  Months    Status  On-going    Target Date  08/13/18            Plan - 07/13/18 2331    Clinical Impression Statement  Patient met or partially met STGs. Patient's wound appears to be healing. Patient appears to have an understanding of fitness plan &  running/jumping.     Rehab Potential  Good    PT Frequency  1x / week    PT Duration  8 weeks    PT Treatment/Interventions  ADLs/Self Care Home Management;DME Instruction;Gait training;Stair training;Functional mobility training;Therapeutic activities;Therapeutic exercise;Balance training;Neuromuscular re-education;Patient/family  education;Prosthetic Training;Vestibular    PT Next Visit Plan  check wound & LTGs    Consulted and Agree with Plan of Care  Patient       Patient will benefit from skilled therapeutic intervention in order to improve the following deficits and impairments:  Abnormal gait, Decreased activity tolerance, Decreased balance, Decreased endurance, Decreased knowledge of use of DME, Decreased mobility, Postural dysfunction, Prosthetic Dependency  Visit Diagnosis: Other abnormalities of gait and mobility  Unsteadiness on feet  Pain in left leg     Problem List Patient Active Problem List   Diagnosis Date Noted  . 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 Mass of soft tissue of left lower extremity 09/30/2017    Gianah Batt PT, DPT 07/13/2018, 11:36 PM  [facility] [address] [place], [place], [address] Phone: [phone]   Fax:  [phone]  Name: Ricky Hansen MRN: [id number] Date of Birth: May 12, 1976

## 2018-07-14 ENCOUNTER — Encounter: Payer: BLUE CROSS/BLUE SHIELD | Admitting: Physical Therapy

## 2018-07-21 ENCOUNTER — Ambulatory Visit: Payer: BLUE CROSS/BLUE SHIELD | Admitting: Physical Therapy

## 2018-07-21 ENCOUNTER — Encounter: Payer: Self-pay | Admitting: Physical Therapy

## 2018-07-21 DIAGNOSIS — R2689 Other abnormalities of gait and mobility: Secondary | ICD-10-CM | POA: Diagnosis not present

## 2018-07-21 DIAGNOSIS — R2681 Unsteadiness on feet: Secondary | ICD-10-CM

## 2018-07-22 NOTE — Therapy (Signed)
[facility] [address] [place], [place], [address] Phone: [phone]   Fax:  [phone]  Physical Therapy Treatment  Patient Details  Name: Ricky Hansen MRN: [id number] Date of Birth: 06-28-1975 Referring Provider (PT): Rolena Infante, MD   Encounter Date: 07/21/2018  PT End of Session - 07/21/18 1108    Visit Number  6    Number of Visits  9    Date for PT Re-Evaluation  08/13/18    Authorization Type  BCBS    PT Start Time  1104    PT Stop Time  1144    PT Time Calculation (min)  40 min    Equipment Utilized During Treatment  Gait belt    Activity Tolerance  Patient tolerated treatment well    Behavior During Therapy  [facility] for tasks assessed/performed       History reviewed. No pertinent past medical history.  History reviewed. No pertinent surgical history.  There were no vitals filed for this visit.  Subjective Assessment - 07/21/18 1105    Subjective  No new complaints. No falls or pain. Does have some phantom pain mostly at night.     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 His work does require some lifting, climbing, push/pull    Currently in Pain?  No/denies    Pain Score  0-No pain            [facility] Adult PT Treatment/Exercise - 07/21/18 1109      Transfers   Transfers  Sit to Stand;Stand to Sit    Sit to Stand  6: Modified independent (Device/Increase time)    Stand to Sit  6: Modified independent (Device/Increase time)      Ambulation/Gait   Ambulation/Gait  Yes    Ambulation/Gait Assistance  5: Supervision    Ambulation Distance (Feet)  --   into/out of/around gym   Assistive device  Prosthesis;None    Gait Pattern  Step-through pattern;Decreased arm swing - left;Decreased step length -  right;Decreased stance time - left;Left flexed knee in stance;Antalgic;Abducted - left    Ambulation Surface  Level;Indoor      Therapeutic Activites    Therapeutic Activities  Lifting    Lifting  lifting 20# crate from floor, carrying for ~50 feet to set on waist high table. lifitng from table and carrying ~50 feet to set on floor. lifting from floor, carrying for ~60 feet to set on floor and slide under mat table. reminder cues on stance with lifting on `1st rep only.       Neuro Re-ed    Neuro Re-ed Details   for balance/coordination: gait along ~50 foot hallway with head movements left<>fwd<>right, then up<>fwd<>down x 4 laps each. pt with a fast gait speed, no veering noted. min guard assist for safety.                             Exercises   Exercises  Other Exercises    Other Exercises   pt educated on prosthetic foot placement on pedal of bike. able to use floor bike in session with proper prosthetic foot placement. dicussed technique for mouting and dismounting non stationary bikes; also educated pt on use of elliptical with prosthesis with return  demonstration in session today.       Prosthetics   Prosthetic Care Comments   see's prosthetist tomorrow and hops to get test socket tomorrow.     Current prosthetic wear tolerance (days/week)   daily    Current prosthetic wear tolerance (#hours/day)   most of awake hours    Edema  none    Residual limb condition   small superficial wound has healed.           Balance Exercises - 07/21/18 1129      Balance Exercises: Standing   Standing Eyes Opened  --    Standing Eyes Closed  Narrow base of support (BOS);Wide (BOA);Head turns;Foam/compliant surface;Other reps (comment);30 secs;Limitations    Rockerboard  Anterior/posterior;Lateral;EO;EC;30 seconds;10 reps      Balance Exercises: Standing   Standing Eyes Closed Limitations  on airex with no UE support: wide stance progressing to narrow stance for EC no head movements, then EC  head movements left<>right, up<>down and diagonals both ways. min guard to min assist with incr assist needed with narrow base of support.     Rebounder Limitations  performed both ways on balance board: holding the board steady- alt UE raises progressing to bil UE raises; then EC no head movements. up to min assist needed with vision removed, othewise pt was min guard assist for balance.            PT Short Term Goals - 07/13/18 2329      PT SHORT TERM GOAL #1   Title  Patient tolerates prosthesis wear >12hrs/day total without skin issues. (All STG Target Dates: 07/16/2018)    Baseline  Partially MET 07/13/2018 pt wearing prosthesis most of awake hours. wound developed initially but is healing    Time  1    Period  Months    Status  Partially Met    Target Date  07/16/18      PT SHORT TERM GOAL #2   Title  Patient verbalizes proper adjustment of ply socks for limb volume changes.     Baseline  MET 07/13/2018    Time  1    Period  Months    Status  Achieved    Target Date  07/16/18      PT SHORT TERM GOAL #3   Title  Patient demonstrates lifting & carrying 20# box with verbal cues.     Baseline  MET 07/13/2018    Time  1    Period  Months    Status  Achieved    Target Date  07/16/18      PT SHORT TERM GOAL #4   Title  Patient ambulates 500' with prosthesis only scanning without loss of balance.     Baseline  MET 07/13/2018    Time  1    Period  Months    Status  Achieved    Target Date  07/16/18        PT Long Term Goals - 07/13/18 2331      PT LONG TERM GOAL #1   Title  Patient verbalizes & demonstrates understanding of prosthetic care for safe use of prosthesis. (All LTGs Target Date: 08/13/2018)    Time  2    Period  Months    Status  On-going    Target Date  08/13/18      PT LONG TERM GOAL #2   Title  Patient tolerates prosthesis wear >90% of awake hours without skin or limb pain issues to enable function  throughout his day.     Time  2    Period  Months     Status  On-going    Target Date  08/13/18      PT LONG TERM GOAL #3   Title  Functional Gait Assessment >22/30 to indicate lower fall risk.     Time  2    Period  Months    Status  New      PT LONG TERM GOAL #4   Title  Patient ambulates >1000' including grass, ramps, curbs & stairs without device except prosthesis independently.     Time  2    Period  Months    Status  On-going    Target Date  08/13/18      PT LONG TERM GOAL #5   Title  Patient demonstrates proper prosthesis use with lifting, carrying, pushing, pulling & climbing to enable return to work tasks.     Time  8    Period  Months    Status  On-going    Target Date  08/13/18      PT LONG TERM GOAL #6   Title  Patient demonstrates & verbalizes proper prosthesis use with exercising including lifting weights, cycling, etc.     Time  2    Period  Months    Status  On-going    Target Date  08/13/18            Plan - 07/21/18 1108    Clinical Impression Statement  Today's skilled session focused on high level balance activities with prosthesis only. The pt is progressing well toward goals and should benefit from continued PT to progress toward unmet goals.     Rehab Potential  Good    PT Frequency  1x / week    PT Duration  8 weeks    PT Treatment/Interventions  ADLs/Self Care Home Management;DME Instruction;Gait training;Stair training;Functional mobility training;Therapeutic activities;Therapeutic exercise;Balance training;Neuromuscular re-education;Patient/family education;Prosthetic Training;Vestibular    PT Next Visit Plan  monitor limb, work towards Speed.    Consulted and Agree with Plan of Care  Patient       Patient will benefit from skilled therapeutic intervention in order to improve the following deficits and impairments:  Abnormal gait, Decreased activity tolerance, Decreased balance, Decreased endurance, Decreased knowledge of use of DME, Decreased mobility, Postural dysfunction, Prosthetic  Dependency  Visit Diagnosis: Other abnormalities of gait and mobility  Unsteadiness on feet     Problem List Patient Active Problem List   Diagnosis Date Noted  . 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 Mass of soft tissue of left lower extremity 09/30/2017    Willow Ora, PTA, [facility] [address], [place], [place] [address] [phone] 07/22/18, 11:46 AM   Name: Ricky Hansen MRN: [id number] Date of Birth: 11/25/1975

## 2018-07-28 ENCOUNTER — Encounter: Payer: Self-pay | Admitting: Physical Therapy

## 2018-07-28 ENCOUNTER — Ambulatory Visit: Payer: BLUE CROSS/BLUE SHIELD | Admitting: Physical Therapy

## 2018-07-28 DIAGNOSIS — R2689 Other abnormalities of gait and mobility: Secondary | ICD-10-CM

## 2018-07-28 DIAGNOSIS — R2681 Unsteadiness on feet: Secondary | ICD-10-CM

## 2018-07-28 DIAGNOSIS — M79605 Pain in left leg: Secondary | ICD-10-CM

## 2018-07-29 NOTE — Therapy (Signed)
[facility] [address] [place], [place], [address] Phone: [phone]   Fax:  [phone]  Physical Therapy Treatment & Discharge Summary  Patient Details  Name: Ricky Hansen MRN: [id number] Date of Birth: November 08, 1975 Referring Provider (PT): Rolena Infante, MD   Encounter Date: 07/28/2018   PHYSICAL THERAPY DISCHARGE SUMMARY  Visits from Start of Care: 7  Current functional level related to goals / functional outcomes: See below   Remaining deficits: See below   Education / Equipment: Prosthetic care, exercising including riding bike & ongoing support including [place]: Patient agrees to discharge.  Patient goals were met. Patient is being discharged due to meeting the stated rehab goals.  ?????          PT End of Session - 07/28/18 0928    Visit Number  7    Number of Visits  9    Date for PT Re-Evaluation  08/13/18    Authorization Type  BCBS    PT Start Time  0848    PT Stop Time  0926    PT Time Calculation (min)  38 min    Equipment Utilized During Treatment  Gait belt    Activity Tolerance  Patient tolerated treatment well    Behavior During Therapy  WFL for tasks assessed/performed       History reviewed. No pertinent past medical history.  History reviewed. No pertinent surgical history.  There were no vitals filed for this visit.  Subjective Assessment - 07/28/18 0851    Subjective  He is wearing prosthesis all awake hours with no issues. He has returned to most of awake hours.     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 His work does require some lifting, climbing, push/pull    Currently in Pain?  No/denies         [facility] PT Assessment - 07/28/18 0850      Ambulation/Gait    Ambulation/Gait  Yes    Ambulation/Gait Assistance  7: Independent    Ambulation Distance (Feet)  1000 Feet    Assistive device  Prosthesis;None    Gait Pattern  Within Functional Limits    Ambulation Surface  Indoor;Level;Outdoor;Grass    Gait velocity  4.03 ft/sec comfortable & 5.58 ft/sec fast pace    Stairs  Yes    Stairs Assistance  6: Modified independent (Device/Increase time)    Stair Management Technique  No rails;Alternating pattern;Forwards    Number of Stairs  4   3 reps   Ramp  7: Independent   prosthesis only   Curb  7: Independent   prosthesis only     Functional Gait  Assessment   Gait Level Surface  Walks 20 ft in less than 5.5 sec, no assistive devices, good speed, no evidence for imbalance, normal gait pattern, deviates no more than 6 in outside of the 12 in walkway width.    Change in Gait Speed  Able to smoothly change walking speed without loss of balance or gait deviation. Deviate no more than 6 in outside of the 12 in walkway width.    Gait with Horizontal Head Turns  Performs head turns smoothly with no change in gait. Deviates no more than 6 in outside 12 in walkway width  Gait with Vertical Head Turns  Performs head turns with no change in gait. Deviates no more than 6 in outside 12 in walkway width.    Gait and Pivot Turn  Pivot turns safely within 3 sec and stops quickly with no loss of balance.    Step Over Obstacle  Is able to step over 2 stacked shoe boxes taped together (9 in total height) without changing gait speed. No evidence of imbalance.    Gait with Narrow Base of Support  Ambulates 4-7 steps.    Gait with Eyes Closed  Walks 20 ft, no assistive devices, good speed, no evidence of imbalance, normal gait pattern, deviates no more than 6 in outside 12 in walkway width. Ambulates 20 ft in less than 7 sec.    Ambulating Backwards  Walks 20 ft, no assistive devices, good speed, no evidence for imbalance, normal gait    Steps  Alternating feet, no rail.     Total Score  28      Prosthetics Assessment - 07/28/18 0845      Prosthetics   Prosthetic Care Independent with  Skin check;Residual limb care;Care of non-amputated limb;Prosthetic cleaning;Ply sock cleaning;Correct ply sock adjustment;Proper wear schedule/adjustment;Proper weight-bearing schedule/adjustment    Donning prosthesis   Modified independent (Device/Increase time)    Doffing prosthesis   Modified independent (Device/Increase time)    Current prosthetic wear tolerance (days/week)   daily    Current prosthetic wear tolerance (#hours/day)   most of awake hours    Current prosthetic weight-bearing tolerance (hours/day)   No pain with weight bearing >30 minutes.     Edema  none    Residual limb condition   no open areas. Distal limb with red circle from pressure. He reports that he is getting new socket in next week.     K code/activity level with prosthetic use   K4 full community with variable cadence, high impact issues                  [facility] Adult PT Treatment/Exercise - 07/28/18 0850      High Level Balance   High Level Balance Activities  Negotitating around obstacles;Negotiating over obstacles;Head turns      Therapeutic Activites    Lifting  lifts 30# crate & carries 100' safely    Work Simulation  pushes & pulls weighted rolling cart safely             PT Education - 07/28/18 0910    Education Details  [place], technique & progression of activity for riding a bike with Transtibial prosthesis,     Person(s) Educated  Patient    Methods  Explanation;Demonstration    Comprehension  Verbalized understanding       PT Short Term Goals - 07/13/18 2329      PT SHORT TERM GOAL #1   Title  Patient tolerates prosthesis wear >12hrs/day total without skin issues. (All STG Target Dates: 07/16/2018)    Baseline  Partially MET 07/13/2018 pt wearing prosthesis most of awake hours. wound developed initially but is healing    Time  1    Period   Months    Status  Partially Met    Target Date  07/16/18      PT SHORT TERM GOAL #2   Title  Patient verbalizes proper adjustment of ply socks for limb volume changes.     Baseline  MET 07/13/2018    Time  1    Period  Months    Status  Achieved    Target Date  07/16/18      PT SHORT TERM GOAL #3   Title  Patient demonstrates lifting & carrying 20# box with verbal cues.     Baseline  MET 07/13/2018    Time  1    Period  Months    Status  Achieved    Target Date  07/16/18      PT SHORT TERM GOAL #4   Title  Patient ambulates 500' with prosthesis only scanning without loss of balance.     Baseline  MET 07/13/2018    Time  1    Period  Months    Status  Achieved    Target Date  07/16/18        PT Long Term Goals - 07/28/18 1800      PT LONG TERM GOAL #1   Title  Patient verbalizes & demonstrates understanding of prosthetic care for safe use of prosthesis. (All LTGs Target Date: 08/13/2018)    Baseline  MET 07/28/2018    Time  2    Period  Months    Status  Achieved      PT LONG TERM GOAL #2   Title  Patient tolerates prosthesis wear >90% of awake hours without skin or limb pain issues to enable function throughout his day.     Baseline  MET 07/28/2018    Time  2    Period  Months    Status  Achieved      PT LONG TERM GOAL #3   Title  Functional Gait Assessment >22/30 to indicate lower fall risk.     Baseline  MET 07/28/2018  FGA 28/30    Time  2    Period  Months    Status  Achieved      PT LONG TERM GOAL #4   Title  Patient ambulates >1000' including grass, ramps, curbs & stairs without device except prosthesis independently.     Baseline  MET 07/28/2018    Time  2    Period  Months    Status  Achieved      PT LONG TERM GOAL #5   Title  Patient demonstrates proper prosthesis use with lifting, carrying, pushing, pulling & climbing to enable return to work tasks.     Baseline  MET 07/28/2018    Time  8    Period  Months    Status  Achieved      PT LONG TERM GOAL  #6   Title  Patient demonstrates & verbalizes proper prosthesis use with exercising including lifting weights, cycling, etc.     Baseline  MET 07/28/2018    Time  2    Period  Months    Status  Achieved            Plan - 07/28/18 1800    Clinical Impression Statement  Patient met all LTGs. He is functioning at full community level and has returned to exercising.    Rehab Potential  Good    PT Frequency  1x / week    PT Duration  8 weeks    PT Treatment/Interventions  ADLs/Self Care Home Management;DME Instruction;Gait training;Stair training;Functional mobility training;Therapeutic activities;Therapeutic exercise;Balance training;Neuromuscular re-education;Patient/family education;Prosthetic Training;Vestibular    PT Next Visit Plan  discharge    Consulted and Agree with Plan of Care  Patient       Patient will benefit from skilled therapeutic intervention in order to improve  the following deficits and impairments:  Abnormal gait, Decreased activity tolerance, Decreased balance, Decreased endurance, Decreased knowledge of use of DME, Decreased mobility, Postural dysfunction, Prosthetic Dependency  Visit Diagnosis: Other abnormalities of gait and mobility  Unsteadiness on feet  Pain in left leg     Problem List Patient Active Problem List   Diagnosis Date Noted  . 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 Mass of soft tissue of left lower extremity 09/30/2017    Cayenne Breault PT, DPT 07/29/2018, 6:09 AM  [facility] [address] [place], [place], [address] Phone: [phone]   Fax:  [phone]  Name: Ricky Hansen MRN: [id number] Date of Birth: [birth year]

## 2018-08-04 ENCOUNTER — Encounter: Payer: BLUE CROSS/BLUE SHIELD | Admitting: Physical Therapy

## 2018-08-11 ENCOUNTER — Encounter: Payer: BLUE CROSS/BLUE SHIELD | Admitting: Physical Therapy

## 2018-10-19 IMAGING — CT NM PET TUM IMG INITIAL (PI) WHOLE BODY
1 of 9 series · 3 of 25 positions shown · non-contrast
Comparison: MRI left foot from 10/14/2017

CLINICAL DATA: Initial treatment strategy for myxofibrosarcoma of
the left foot.

EXAM:
NUCLEAR MEDICINE PET WHOLE BODY
TECHNIQUE: 9.1 mCi F-18 FDG was injected intravenously. Full-ring PET imaging
was performed from the skull base to thigh after the radiotracer. CT
data was obtained and used for attenuation correction and anatomic
localization.
Fasting blood glucose: 111 mg/dl

[Series 4: ct wb 5.0 hd_fov · axial · 5.0mm · 1.22mm/px · z∈[+662,+1574]mm · 3 of 456 slices shown]
[im 114/456  soft-tissue]
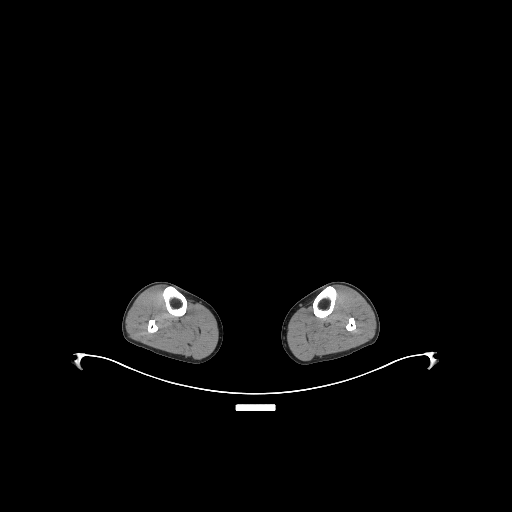
[im 228/456  soft-tissue]
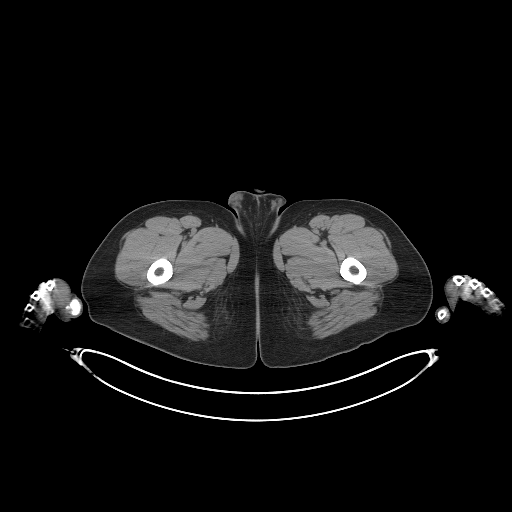
[im 342/456  soft-tissue]
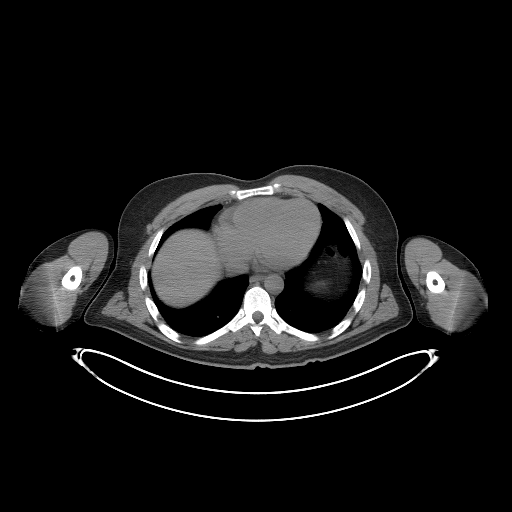

[3 of 25 positions shown; findings below may reference images not displayed]

FINDINGS: Mediastinal blood pool activity: SUV max

HEAD/NECK: Right palatine tonsillar activity maximum SUV 9.9. Left
palatine tonsillar activity maximum SUV 8.1.

Posterior glottic activity slightly greater on the right side than
the left, likely physiologic.

Scattered hypermetabolic brown fat activity in the neck and
supraclavicular region.

Incidental CT findings: none

CHEST: Hypermetabolic brown fat in the upper mediastinum, benign.
Similar paraspinal metabolically active adipose tissue.

Incidental CT findings: none

ABDOMEN/PELVIS: Physiologic activity in bowel.

Incidental CT findings: Curvilinear calcifications in the lower
prostate gland.

SKELETON: No significant abnormal hypermetabolic activity in this
region.

Incidental CT findings: none

EXTREMITIES: Abnormal accentuated activity in infiltrative pattern
along the dorsum of the left foot, maximum SUV 7.5.

Incidental CT findings: none
IMPRESSION: 1. Hypermetabolic infiltration of the dorsum of the left foot
compatible with the clinical diagnosis of sarcoma. No findings of
distant metastatic spread.
2. Multifocal activity in the neck, supraclavicular region, and
upper mediastinum corresponding to adipose tissue, compatible with
benign hypermetabolic brown fat.
3. Bilateral palatine tonsillar activity, slightly greater on the
right than the left, most likely physiologic although perhaps
meriting visual assessment.

## 2020-05-17 ENCOUNTER — Ambulatory Visit (INDEPENDENT_AMBULATORY_CARE_PROVIDER_SITE_OTHER): Payer: BC Managed Care – PPO | Admitting: Orthopedic Surgery

## 2020-05-17 DIAGNOSIS — D3613 Benign neoplasm of peripheral nerves and autonomic nervous system of lower limb, including hip: Secondary | ICD-10-CM | POA: Diagnosis not present

## 2020-05-17 DIAGNOSIS — Z89512 Acquired absence of left leg below knee: Secondary | ICD-10-CM

## 2020-05-21 ENCOUNTER — Encounter: Payer: Self-pay | Admitting: Orthopedic Surgery

## 2020-05-21 NOTE — Progress Notes (Signed)
Office Visit Note   Patient: Ricky Hansen           Date of Birth: [birth year]           MRN: [id number] Visit Date: 05/17/2020              Requested by: Derinda Late, MD [address] [facility] [place],  [place] [address] PCP: Derinda Late, MD  Chief Complaint  Patient presents with  . Left Leg - Pain      HPI: Patient is a 44 year old gentleman who is seen for initial evaluation for neuroma pain left transtibial amputation.  Patient underwent a transtibial amputation at [facility] in 2019 for a myxofibrosarcoma.  Patient states that he did have targeted muscle reinnervation and has had neuroma pain since.  Patient has had a spinal cord stimulator placed to help with his symptoms.  Patient states that he is considering a revision to the targeted muscle reinnervation.  Patient complains of neuroma pain over the redirected nerves.  He states he has been treated with Lyrica gabapentin teen.  Assessment & Plan: Visit Diagnoses:  1. Neuroma of left leg   2. 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  I discussed that I do not have any experience with the targeted muscle reinnervation and at this point would recommend neuroma resection versus revision of the targeted muscle reinnervation.  Follow-Up Instructions: No follow-ups on file.   Ortho Exam  Patient is alert, oriented, no adenopathy, well-dressed, normal affect, normal respiratory effort. Examination patient has a very thin atrophic residual limb.  He has a palpable neuroma mass medially which feels like the reposition sciatic nerve.  Patient also has neuroma pain over the anterior compartment which does not have a palpable mass but is also an area of neuroma pain.  There is no hypersensitivity to light touch no evidence of a dystrophy.  Imaging: No results found. No images are  attached to the encounter.  Labs: No results found for: HGBA1C, ESRSEDRATE, CRP, LABURIC, REPTSTATUS, GRAMSTAIN, CULT, LABORGA   No results found for: ALBUMIN, PREALBUMIN, LABURIC  No results found for: MG No results found for: VD25OH  No results found for: PREALBUMIN No flowsheet data found.   There is no height or weight on file to calculate BMI.  Orders:  No orders of the defined types were placed in this encounter.  No orders of the defined types were placed in this encounter.    Procedures: No procedures performed  Clinical Data: No additional findings.  ROS:  All other systems negative, except as noted in the HPI. Review of Systems  Objective: Vital Signs: There were no vitals taken for this visit.  Specialty Comments:  No specialty comments available.  PMFS History: Patient Active Problem List   Diagnosis Date Noted  . 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 Mass of soft tissue of left lower extremity 09/30/2017   History reviewed. No pertinent past medical history.  History reviewed. No pertinent family history.  History reviewed. No pertinent surgical history. Social History   Occupational History  . Not on file  Tobacco Use  . Smoking status: Not on file  . Smokeless tobacco: Not on file  Substance and Sexual Activity  . Alcohol use: Not on file  . Drug use: Not on file  . Sexual activity: Not on  file

## 2020-06-14 ENCOUNTER — Other Ambulatory Visit: Payer: BC Managed Care – PPO

## 2020-06-15 ENCOUNTER — Other Ambulatory Visit: Payer: BC Managed Care – PPO

## 2022-11-29 ENCOUNTER — Encounter (HOSPITAL_COMMUNITY): Payer: Self-pay | Admitting: Emergency Medicine

## 2022-11-29 ENCOUNTER — Ambulatory Visit (HOSPITAL_COMMUNITY)
Admission: EM | Admit: 2022-11-29 | Discharge: 2022-11-29 | Disposition: A | Discharge status: Home / Self Care | Payer: BC Managed Care – PPO | Attending: Family Medicine | Admitting: Family Medicine

## 2022-11-29 ENCOUNTER — Other Ambulatory Visit: Payer: Self-pay

## 2022-11-29 DIAGNOSIS — L03116 Cellulitis of left lower limb: Secondary | ICD-10-CM | POA: Diagnosis not present

## 2022-11-29 MED ORDER — CEPHALEXIN 500 MG PO CAPS: 500.0000 mg | ORAL_CAPSULE | Freq: Four times a day (QID) | ORAL | 0 refills | Status: AC

## 2022-11-29 MED ORDER — MUPIROCIN 2 % EX OINT: 1.0000 | TOPICAL_OINTMENT | Freq: Two times a day (BID) | CUTANEOUS | 0 refills | Status: AC

## 2022-11-29 NOTE — Discharge Instructions (Signed)
Please apply mupirocin ointment as prescribed and cover with nonstick dressing. Take oral antibiotic keflex as prescribed. Drink plenty of water. Keep an eye on the wound, if you develop fever, nausea,vomiting,streaking,purulent drainage go to Er immediately for further evaluation. Follow up with PCP in 3 days for wound check,sooner if worse. Return as needed.

## 2022-11-29 NOTE — ED Triage Notes (Signed)
Liner of prosthesis has cracked and has irritated back of patient's left leg.  Patient reports a blood blister and wants to make sure it is not infected.

## 2022-11-29 NOTE — ED Provider Notes (Signed)
[facility]    CSN: [id number] Arrival date & time: 11/29/22  1614      History   Chief Complaint No chief complaint on file.   HPI Ricky Hansen is a 47 y.o. male.   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 Pt denies any fever, nausea,vomiting,muscle aches etc.   The history is provided by the patient. No language interpreter was used.    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 Authorizing Provider  cephALEXin (KEFLEX) 500 MG capsule Take 1 capsule (500 mg total) by mouth 4 (four) times daily for 7 days. 11/29/22 12/06/22 Yes Jamyron Redd, Para March, NP  mupirocin ointment (BACTROBAN) 2 % Apply 1 Application topically 2 (two) times daily for 7 days. 11/29/22 12/06/22 Yes Zenia Guest, Para March, NP    Family History History reviewed. No pertinent family history.  Social History Social History   Tobacco Use   Smoking status: Never   Smokeless tobacco: Never  Vaping Use   Vaping Use: Never used  Substance Use Topics   Alcohol use: Yes   Drug use: Never     Allergies   Sulfa antibiotics   Review of Systems Review of Systems  Constitutional:  Negative for fever.  Skin:  Positive for color change and wound.  All other systems reviewed and are negative.    Physical Exam Triage Vital Signs ED Triage Vitals  Enc Vitals Group     BP 11/29/22 1639 (!) 142/87     Pulse Rate  11/29/22 1639 66     Resp 11/29/22 1639 20     Temp 11/29/22 1639 98 F (36.7 C)     Temp Source 11/29/22 1639 Oral     SpO2 11/29/22 1639 96 %     Weight --      Height --      Head Circumference --      Peak Flow --      Pain Score 11/29/22 1636 0     Pain Loc --      Pain Edu? --      Excl. in GC? --    No data found.  Updated Vital Signs BP (!) 142/87 (BP Location: Right Arm)   Pulse 66   Temp 98 F (36.7 C) (Oral)   Resp 20   SpO2 96%   Visual Acuity Right Eye Distance:   Left Eye Distance:   Bilateral Distance:    Right Eye Near:   Left Eye Near:    Bilateral Near:     Physical Exam Vitals and nursing note reviewed.  Constitutional:      General: He is not in acute distress.    Appearance: He is well-developed.  HENT:     Head: Normocephalic and atraumatic.  Eyes:     Conjunctiva/sclera: Conjunctivae normal.  Cardiovascular:  Rate and Rhythm: Normal rate and regular rhythm.     Heart sounds: No murmur heard. Pulmonary:     Effort: Pulmonary effort is normal. No respiratory distress.     Breath sounds: Normal breath sounds.  Abdominal:     Palpations: Abdomen is soft.     Tenderness: There is no abdominal tenderness.  Musculoskeletal:        General: No swelling.     Cervical back: Neck supple.  Skin:    General: Skin is warm and dry.     Capillary Refill: Capillary refill takes less than 2 seconds.     Findings: Wound present.       Neurological:     General: No focal deficit present.     Mental Status: He is alert and oriented to person, place, and time.     GCS: GCS eye subscore is 4. GCS verbal subscore is 5. GCS motor subscore is 6.  Psychiatric:        Attention and Perception: Attention normal.        Mood and Affect: Mood normal.        Speech: Speech normal.        Behavior: Behavior normal.      UC Treatments / Results  Labs (all labs ordered are listed, but only abnormal results are displayed) Labs Reviewed - No data to  display  EKG   Radiology No results found.  Procedures Procedures (including critical care time)  Medications Ordered in UC Medications - No data to display  Initial Impression / Assessment and Plan / UC Course  I have reviewed the triage vital signs and the nursing notes.  Pertinent labs & imaging results that were available during my care of the patient were reviewed by me and considered in my medical decision making (see chart for details).    Patient verbalized understanding to this provider, regarding plan of care.  Ddx: Cellulitis, folliculitis, insect bite/sting, abrasion/contusion Final Clinical Impressions(s) / UC Diagnoses   Final diagnoses:  Cellulitis of left lower extremity     Discharge Instructions      Please apply mupirocin ointment as prescribed and cover with nonstick dressing. Take oral antibiotic keflex as prescribed. Drink plenty of water. Keep an eye on the wound, if you develop fever, nausea,vomiting,streaking,purulent drainage go to Er immediately for further evaluation. Follow up with PCP in 3 days for wound check,sooner if worse. Return as needed.      ED Prescriptions     Medication Sig Dispense Auth. Provider   mupirocin ointment (BACTROBAN) 2 % Apply 1 Application topically 2 (two) times daily for 7 days. 15 g Kissa Campoy, NP   cephALEXin (KEFLEX) 500 MG capsule Take 1 capsule (500 mg total) by mouth 4 (four) times daily for 7 days. 28 capsule Jasminne Mealy, Para March, NP      PDMP not reviewed this encounter.   Clancy Gourd, NP 11/29/22 1714
